# Patient Record
Sex: Male | Born: 1935 | Race: White | Hispanic: No | Marital: Married | Smoking: Former smoker
Health system: Southern US, Community
[De-identification: ages and names within clinical notes are randomized; demographics above are authoritative.]

## PROBLEM LIST (undated history)

## (undated) DIAGNOSIS — R269 Unspecified abnormalities of gait and mobility: Secondary | ICD-10-CM

## (undated) DIAGNOSIS — F1010 Alcohol abuse, uncomplicated: Secondary | ICD-10-CM

## (undated) DIAGNOSIS — M1990 Unspecified osteoarthritis, unspecified site: Secondary | ICD-10-CM

## (undated) DIAGNOSIS — R413 Other amnesia: Secondary | ICD-10-CM

## (undated) DIAGNOSIS — H3530 Unspecified macular degeneration: Secondary | ICD-10-CM

## (undated) DIAGNOSIS — C61 Malignant neoplasm of prostate: Secondary | ICD-10-CM

## (undated) DIAGNOSIS — F429 Obsessive-compulsive disorder, unspecified: Secondary | ICD-10-CM

## (undated) HISTORY — DX: Malignant neoplasm of prostate: C61

## (undated) HISTORY — DX: Alcohol abuse, uncomplicated: F10.10

## (undated) HISTORY — PX: FRACTURE SURGERY: SHX138

## (undated) HISTORY — DX: Obsessive-compulsive disorder, unspecified: F42.9

## (undated) HISTORY — DX: Other amnesia: R41.3

## (undated) HISTORY — DX: Unspecified macular degeneration: H35.30

## (undated) HISTORY — DX: Unspecified abnormalities of gait and mobility: R26.9

---

## 1998-05-23 ENCOUNTER — Encounter: Admission: RE | Admit: 1998-05-23 | Discharge: 1998-07-03 | Payer: Self-pay | Admitting: Specialist

## 1998-10-08 ENCOUNTER — Encounter: Admission: RE | Admit: 1998-10-08 | Discharge: 1998-10-08 | Payer: Self-pay | Admitting: Internal Medicine

## 2000-02-17 ENCOUNTER — Encounter: Admission: RE | Admit: 2000-02-17 | Discharge: 2000-02-17 | Payer: Self-pay | Admitting: Internal Medicine

## 2000-06-22 ENCOUNTER — Encounter: Admission: RE | Admit: 2000-06-22 | Discharge: 2000-06-22 | Payer: Self-pay | Admitting: Internal Medicine

## 2000-11-02 ENCOUNTER — Encounter: Payer: Self-pay | Admitting: Specialist

## 2000-11-05 ENCOUNTER — Inpatient Hospital Stay (HOSPITAL_COMMUNITY): Admission: RE | Admit: 2000-11-05 | Discharge: 2000-11-13 | Payer: Self-pay | Admitting: Specialist

## 2000-11-08 ENCOUNTER — Encounter: Payer: Self-pay | Admitting: Specialist

## 2000-11-10 ENCOUNTER — Encounter: Payer: Self-pay | Admitting: Specialist

## 2000-11-11 ENCOUNTER — Encounter: Payer: Self-pay | Admitting: Specialist

## 2001-02-26 ENCOUNTER — Encounter: Admission: RE | Admit: 2001-02-26 | Discharge: 2001-02-26 | Payer: Self-pay | Admitting: Internal Medicine

## 2001-11-10 ENCOUNTER — Encounter: Admission: RE | Admit: 2001-11-10 | Discharge: 2001-11-10 | Payer: Self-pay | Admitting: Internal Medicine

## 2002-07-13 ENCOUNTER — Encounter: Admission: RE | Admit: 2002-07-13 | Discharge: 2002-07-13 | Payer: Self-pay | Admitting: Internal Medicine

## 2002-11-18 ENCOUNTER — Encounter: Admission: RE | Admit: 2002-11-18 | Discharge: 2002-11-18 | Payer: Self-pay | Admitting: Internal Medicine

## 2003-01-13 ENCOUNTER — Encounter: Admission: RE | Admit: 2003-01-13 | Discharge: 2003-01-13 | Payer: Self-pay | Admitting: Internal Medicine

## 2003-06-01 ENCOUNTER — Encounter: Admission: RE | Admit: 2003-06-01 | Discharge: 2003-06-01 | Payer: Self-pay | Admitting: Internal Medicine

## 2003-10-19 ENCOUNTER — Encounter: Admission: RE | Admit: 2003-10-19 | Discharge: 2003-10-19 | Payer: Self-pay | Admitting: Internal Medicine

## 2003-10-19 ENCOUNTER — Other Ambulatory Visit: Admission: RE | Admit: 2003-10-19 | Discharge: 2003-10-19 | Payer: Self-pay | Admitting: Internal Medicine

## 2003-10-19 ENCOUNTER — Encounter (INDEPENDENT_AMBULATORY_CARE_PROVIDER_SITE_OTHER): Payer: Self-pay | Admitting: Internal Medicine

## 2005-08-27 ENCOUNTER — Ambulatory Visit: Payer: Self-pay | Admitting: Gastroenterology

## 2007-04-30 ENCOUNTER — Ambulatory Visit: Payer: Self-pay | Admitting: Internal Medicine

## 2007-04-30 DIAGNOSIS — J329 Chronic sinusitis, unspecified: Secondary | ICD-10-CM | POA: Insufficient documentation

## 2007-04-30 DIAGNOSIS — D291 Benign neoplasm of prostate: Secondary | ICD-10-CM

## 2007-04-30 DIAGNOSIS — M129 Arthropathy, unspecified: Secondary | ICD-10-CM | POA: Insufficient documentation

## 2008-04-24 ENCOUNTER — Encounter: Admission: RE | Admit: 2008-04-24 | Discharge: 2008-04-24 | Payer: Self-pay

## 2008-04-28 ENCOUNTER — Encounter: Admission: RE | Admit: 2008-04-28 | Discharge: 2008-04-28 | Payer: Self-pay | Admitting: Orthopedic Surgery

## 2008-05-03 ENCOUNTER — Encounter: Admission: RE | Admit: 2008-05-03 | Discharge: 2008-05-03 | Payer: Self-pay | Admitting: Orthopedic Surgery

## 2008-08-21 ENCOUNTER — Encounter: Payer: Self-pay | Admitting: Internal Medicine

## 2008-11-09 ENCOUNTER — Encounter: Payer: Self-pay | Admitting: Internal Medicine

## 2009-01-30 ENCOUNTER — Encounter: Payer: Self-pay | Admitting: Internal Medicine

## 2009-03-28 ENCOUNTER — Ambulatory Visit (HOSPITAL_COMMUNITY): Admission: RE | Admit: 2009-03-28 | Discharge: 2009-03-28 | Payer: Self-pay | Admitting: Urology

## 2010-08-23 NOTE — H&P (Signed)
[place]. [facility]  Patient:    Carlos Torres, Carlos Torres                   MRN: [id number] Adm. Date:  11/05/00 Attending:  R. Valma Cava, M.D. Dictator:   Irena Cords, P.A.-C. CC:         Madaline Guthrie, M.D.   History and Physical  DATE OF BIRTH:  05/31/1922  SS# [id number]  CHIEF COMPLAINT:  Right knee pain.  HISTORY OF PRESENT ILLNESS:  Carlos Torres is a 77 year old male who presents with increasing right knee pain.  This has been bothering him for a period of years.  He has had a fairly extensive history in regard to the right leg.  Back in 1943 he had osteomyelitis of the right hip at age 12.  This ended up draining spontaneously.  He has had multiple surgical debridements subsequently.  He has done fairly well since that time and has just had an antalgic gait.  He does now have a spontaneous hip fusion.  He has had increasing knee pain, though, over the last few years.  He did undergo a left total knee replacement back in 1998 by Dr. Thedore Mins and has done well from this surgery.  He presents now for consideration of surgical intervention.  REVIEW OF SYSTEMS:  He denies any diplopia or blurred vision.  No headaches or dizziness.  No rhinorrhea, sore throat, or ear ache.  No cough, chest pain, or shortness of breath.  No nausea, vomiting, diarrhea, or constipation.  No abdominal pain.  No dysuria or hematuria.  No melena or bright red blood per rectum.  No numbness and tingling in his extremities.  PAST MEDICAL HISTORY: 1. Osteomyelitis, right hip per the HPI. 2. History of kidney stones. 3. History of benign prostatic hypertrophy.  PAST SURGICAL HISTORY: 1. Left total knee 1998. 2. Right hip multiple procedures per HPI. 3. Left knee arthroscopy. 4. Tonsillectomy. 5. Left inguinal hernia repair. 6. Colonoscopy. 7. Basal cell skin cancer removal.  ALLERGIES:  No known drug allergies.  CURRENT MEDICATIONS:  Aspirin,  ibuprofen, multivitamin.  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  He is married with five children.  Patients primary physicians are [name] for GI and [name] at [facility].  FAMILY HISTORY:  Significant for prostate cancer in his father at age 94.  His mother died at age 53 with anemia.  PHYSICAL EXAMINATION  VITAL SIGNS:  Temperature 97.9, pulse 84, respiratory rate 14, blood pressure 110/80.  GENERAL:  This is a well-developed, well-nourished male in no acute distress.  HEENT:  Head is atraumatic, normocephalic.  Oropharynx is clear without redness, exudate, or lesions.  Pupils are equal, round and reactive to light. Extraocular movements are grossly intact.  NECK:  Supple without cervical lymphadenopathy.  No carotid bruits.  LUNGS:  Clear to auscultation bilaterally with no wheezes or crackles.  HEART:  Regular rate and rhythm with no murmurs, rubs, or gallops.  ABDOMEN:  Soft, nontender, nondistended with no masses, no hepatosplenomegaly. Positive bowel sounds.  GENITOURINARY:  Not performed.  Not pertinent to present illness.  BREASTS:  Not performed.  Not pertinent to present illness.  SKIN:  Intact without rashes or lesions.  EXTREMITIES:  Range of motion of the right knee is 0-110 degrees.  He does have slight varus alignment, trace effusion.  Hip with multiple surgical scars, well healed.  No draining tracts  or sinuses.  LABORATORIES:  X-rays of his right knee demonstrate tricompartmental degenerative arthritis.  IMPRESSION: 1. Right knee osteoarthritis/degenerative joint disease. 2. History of osteomyelitis, right hip at age 57. 3. History of kidney stones. 4. Benign prostatic hypertrophy.  PLAN:  Patient will be admitted to [facility] to undergo a right total knee arthroplasty by [name] on August 1.  Preoperative laboratories and signed surgical consents will be obtained.   We have contacted [name] office regarding preoperative medical clearance.  The last time they saw the patient was back in November 2001.  As of right now they do not have a medical clearance letter for [place].  Apparently this is pending.  I have discussed this with the patient and he will be in contact with his medical physician there for preoperative medical clearance prior to surgery.  We have answered all questions for him today. DD:  11/02/00 TD:  11/02/00 Job: [id number]

## 2010-08-23 NOTE — Op Note (Signed)
[place]. [facility]  Patient:    Carlos Torres, Carlos Torres                 MRN: [id number] Proc. Date: 11/05/00 Adm. Date:  [id number] Attending:  Erasmo Leventhal                           Operative Report  PREOPERATIVE DIAGNOSIS:  Right knee end-stage osteoarthritis.  PROCEDURE:  Right total knee arthroplasty.  SURGEON:  R. Valma Cava, M.D.  ASSISTANT:  Ralene Bathe, P.A.  ANESTHESIA:  General.  ESTIMATED BLOOD LOSS:  Less than 100 cc.  DRAINS:  Two medium Hemovacs.  COMPLICATIONS:  None.  TOURNIQUET TIME:  2 hours 6 minutes at 375 mmHg.  DISPOSITION:  PACU stable.  COMPLICATIONS:  None.  OPERATIVE IMPLANTS:  Osteonics components, posterior-stabilized, size 13 femur, size 13 tibia, 12 mm polyethylene insert, 28 mm polyethylene patella, all cemented.  DESCRIPTION OF PROCEDURE:  The patient was counseled in the holding area. 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  I spent quite some time with the patient reviewing the diagram he had drawn and personally helped in positioning the patient on the OR table. In addition, Dr. Gypsy Balsam was fully informed of the positioning of the patient necessary, and we proceeded to the operating room.  IV antibiotics had been given after IV started.  In the operating room with the patient awake, he told us how to bump him and to place him for the most comfort while he was awake, and we preceded that with a roll underneath the lumbar spine and a bump underneath the right hip. In addition, while he was awake we flexed the right knee and hip to the point where he felt comfortable and then marked the point where he began to feel discomfort and stopped at that point during the surgery.  Every precaution imaginable was given to make sure we protected his right hip and did not injure  his lower back or right hip.  He was then placed under general anesthesia.  A Foley catheter was placed utilizing sterile technique by the OR circulating nurse.  Throughout the entire procedure, we were the most cautious with his positioning throughout the entire case.  The right lower extremity was elevated, prepped with Duraprep, and draped in a sterile fashion.  Exsanguinated with an Esmarch, and the tourniquet was inflated to 375 mmHg.  A straight midline incision made through the skin and subcutaneous tissue, small bleeders electrocoagulated.  Hemostasis was obtained in cutaneous veins.   A medial parapatellar arthrotomy was performed, and a slight medial soft tissue release of the proximal medial tibia.  The bone was found to be markedly dysplastic from his chronic problem going back to childhood.  He had severe osteoporosis, large trabeculae of the bone, and extremely soft with large osteophytes.  Cruciate ligaments were resected.  He had a 10 degree flexion contracture in his knee before surgery.  At this point in time a starter hole was made in the distal femur and irrigated, and the intramedullary rod was gently placed.  I will also note that at this point in time with the history of his infection and possible tumor but, however, being quiescent since the 1950s, I used a very short intramedullary rod and did not go to the  area where his bone was previously infected.  It was set for 5 degree valgus, and we took a 12 mm cut off the distal femur.  At this point in time I was unable due to the lack of flexion to get the tibial and femoral sizing rod.  The medial and lateral menisci were removed under direct visualization.  The tibial eminence was resected and osteophytes of the proximal tibia.  A starting hole was made and a step reamer was utilized, and the tibial canal was gently irrigated and intramedullary rod was placed.  I chose the 5 degree posterior slope cut off the  proximal tibia and took a 10 mm cut off the tibia based upon the lateral side.  Done at the appropriate level. Severe soft bone and marked osteophytes were removed.  Geniculates were coagulated at the meniscal insertions.  Attention directed back to the proximal tibia, which was found to be a size #13.  Attention directed back to the femur, which was found to be a size #13.  Rotational marks were made, and the distal femur was cut to fit a size #13.  Posteromedial and posterolateral femoral osteophytes were removed under direct visualization.  He additionally had several areas of cysts in the femur.  These were curetted, posterolateral femoral condyle and anterolateral area.  These were curetted back to healthy bone and were later filled with cement.  Femoral trochlea was prepared in standard fashion.  At this point in time with a size 13 femur, size 13 tibia, with a 10 mm insert with excellent range of motion and soft tissue balance, rotational marks were made and then the delta keel was performed in the standard fashion.  I will note that the patient did have a posteromedial tibial osteophyte, and it was removed as much as possible.  The patella was found to be a size #28.  It was reamed to a depth of 28 mm, locking holes were made, and the excess bone was removed.  At this point utilizing Modern cement technique, all components were gently and meticulously cemented into place, size 13 femur, size 13 tibia, with a 28 patella.  At this time after the cement had cured, we went through trials with 10 and 12 mm tibial inserts, and the 12 mm insert made an excellent range of motion and soft tissue balance.  I will also note that with the cement, due to his previous infection, I opted to put antibiotic into his cement prophylactically.  We chose the appropriate dose of vancomycin, that being 500 mg per pack of cement, and we mixed two packs of cement and used a total of 1.0 g of powdered  vancomycin, which was appropriate for the cement.  I will note that the chart had also been reviewed, and his germ at the time of infection in the 1950s was Staphylococcus aureus, and it was felt this was the  appropriate antibiotic for him.  Excess cement was removed.  The final 12 mm tibial insert was tapped into position.  Bone wax was placed into the intercondylar notch region and exposed bony surfaces.  At this time I felt the patient was well-aligned and balanced and there had been no complication.  The patellofemoral tracking was lateral, and a lateral release was performed giving anatomic patellofemoral tracking.  I made sure I found the superior lateral geniculate, and this was coagulated.  Two medium Hemovac drains were placed.  Each layer was irrigated with antibiotic solution during the closure. Also  at the beginning of the case at the time of opening the knee, there was no evidence at all of any infection of the knee.  Sequential close in layers was done, synovium Vicryl, arthrotomy Vicryl, subcutaneous Vicryl, skin closed with staples.  Marcaine 0.5% with epinephrine 20 cc was placed into the drain into the knee joint to help with postop pain and hemostasis.  A sterile dressing was applied to the knee, a compressive wrap, and the tourniquet was deflated.  He had excellent alignment and excellent pulses at the end of the case. Ice pack, knee immobilizer applied in full extension.  He was also given another gram of Ancef at the end of the case.  There were no complications. Sponge and needle count were correct.  He was awakened, and he was taken from the operating room to the PACU in stable condition.  I will note that the patients wishes for care and concern about his right lower extremity, hip, and lower back was done throughout the procedure, and we were extremely cautious with his positioning throughout the entire case at each step.  The patient was doing well in the  recovery room at the time of this dictation. DD:  11/05/00 TD:  11/06/00 Job: [id number]

## 2010-08-23 NOTE — Discharge Summary (Signed)
. [facility]  Patient:    Carlos Torres, Carlos Torres Visit Number: [id number] MRN: [id number]          Service Type: SUR Location: 5000 [id number] Attending Physician:  Carlos Torres Dictated by:   Carlos Torres, P.A.-C Admit Date:  11/05/2000 Discharge Date: 11/13/2000                             Discharge Summary  ADMITTING DIAGNOSIS:  Right knee end-stage osteoarthritis.  DISCHARGE DIAGNOSES: 1. Right knee end-stage osteoarthritis. 2. Pseudogout of the left wrist.  PROCEDURE:  Right total knee arthroplasty.  CONSULTATION:  [facility].  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 Valma Torres at [facility].  The patient has an extensive history of increasing right knee pain.  The patient states that his right knee pain has been bothering him for a period of years.  He does have a history of osteomyelitis of the right hip since age 77.  He is status post multiple surgical debridements subsequently for that problem and has done well since that time.  The patient has had a left total replacement in 1998, per Dr. Thedore Torres and tolerated it very well.  Due to his increasing pain and limitation and interference with his daily activities, the decision was made to proceed with surgical diagnosis and treatment.  All questions were encouraged and answered concerning the right knee total arthroplasty.  PHYSICAL EXAMINATION:  GENERAL:  Well-developed, well-nourished, male appearing his stated age in no acute distress.  VITAL SIGNS:  Blood pressure 110/80, pulse 84, respiratory rate 14 and temperature 97.9.  EXTREMITIES:  Range of motion of the right knee approximately 0-110 degrees with a slight varus alignment with no trace effusion with diffuse tenderness of the patella and joint line.  X-rays revealed  right knee tricompartmental degenerative arthritis.  HOSPITAL COURSE:  On November 05, 2000, Carlos Torres underwent a right total knee arthroplasty per Carlos Torres without complications.  Please see operative report for details.  On postop day #1, the patient was doing fairly well.  He complained of no nausea or vomiting.  His pain was well-controlled.  He was afebrile with vital signs stable.  H&H was 12.1 and 34.9.  PT was 13.9 and INR was 1.1.  Physical examination revealed that the right lower extremities and motor function was intact as well as neurovascular exam being intact as well. His Hemovac was removed without difficulty.  The following day, his condition remained stable, however, on postop day #3, the patients temperature spiked to 100.4, white blood cell count 11.7, hemoglobin 10.3.  The patient was complaining of left elbow pain at this time.  He did state that he had a history of left elbow osteoarthritis.  Physical examination of the left elbow showed diffuse swelling and tenderness with warmth, but no erythema or induration noted.  Examination of the right knee was unremarkable.  His incision was clean, dry and intact.  No erythema, no discharge and negative Homans.  An x-ray of the left elbow was obtained and showed severe degenerative osteoarthritis.  Therefore, the elbow was elevated as well as ice pack applied p.r.n.  A posterior splint was applied for comfort.  The patient was also encouraged to utilize the spirometer.  The following day, the patients T-max was 101.4.  However,  he stated that he felt much better than the previous day and his pain was well-controlled.  He also stated that left elbow felt much better.  Physical examination revealed his chest to be clear to auscultation bilaterally.  Heart was regular rate and rhythm.  Abdomen was soft and flat, nontender.  Bowel sounds were normoactive.  Exam of the right knee showed the dressings were clean, dry and  intact.  The incision was without erythema or discharge with no signs of infection.  Motor function of the right lower extremity was intact as well as neurovascular status.  He had 2+ pedal pulses and sensation intact as well.  Labs and x-rays showed a UA previously ordered which was unremarkable for any signs of urinary tract infection.  A chest x-ray had also previously been ordered and showed mild basilar atelectasis.  The patient was encouraged to ambulate to move from the bed to the chair as he previously had been noncompliant with PT.  On postop day #5, the patient had no complaints concerning his right knee.  In fact, he stated, "it was doing great." However, he did state that he an episode the previous night when he was using the rest room and having a bowel movement that he became very dizzy and felt like he was going to pass out.  Afterwards, the patient stated he had an odd chest pain.  At the current time, he denied any shortness of breath, syncopal episodes or diaphoresis.  His blood pressure was 110/75, pulse 93, respiratory rate 20, temperature 101 with O2 saturations 97% on room air.  Examination of the chest with lungs clear bilaterally.  No wheezing, rales or rhonchi noted. Heart was regular rate and rhythm with no murmurs.  Abdomen was benign.  Right knee exam was unremarkable.  His incision was clean, dry and intact.  He had good dorsiflexion and plantar flexion.  He was neurovascularly intact.  Due to the fact that the patient had an intermittent fever that continued to wax and wane and due to his previous symptoms the night before, [facility] was consulted.  The patient had a repeat CBC with differential and a repeat UA as well.  Coumadin management per pharmacy showed an INR of 1.7. The patient was seen and examined by [facility].  Blood cultures were ordered and were found to subsequently be negative.  CT of the chest was ordered  and was negative for PE or pneumonia.  On postop day #6, Dr. Cheral Torres stated that he felt much better and his pain was  well-controlled.  He denied any shortness of breath, chest pains, nausea or vomiting.  He did complain of left wrist pain.  Laboratory data showed his H&H was 9.7 and 28.  He had a temperature of 100.5, blood pressure was 134/70, pulse 90, respiratory rate 22.  Examination of the right knee was unremarkable.  Examination of the left wrist did show swelling or erythema noted in the area of the ulnar styloid.  There was localized tenderness as well as warmth noted.  There is no evidence of a cellulitis.  A three-view x-ray of the left wrist was ordered which revealed chondrocalcinosis and CPPTD which is indicative of pseudogout.  The patient was then started on a trial of Vioxx 50 mg one p.o. q.d.  The following day on postop day #9, he stated that his wrist pain was greatly improved and that his knee was doing quite well. The patient was very  eager to go home.  Physical examination revealed that his blood pressure was 124/70, pulse 81, afebrile at 98.3.  H&H was stable.  WBCs were at 12,000.  INR was therapeutic.  Physical exam of the left wrist showed a decrease in the swelling as well as erythema.  There was not as much point tenderness in the area of the styloid process.  Right knee examination showed the incision to be clean, dry and intact.  Motor function was intact. Neurovascularly, he was intact.  Due to Dr. Andreas Blower improved state, the decision was made to proceed with discharge.  DISPOSITION:  Discharged to home to care of family with home health care services provided by [place].  DIET:  Regular.  ACTIVITIES:  Weightbearing as tolerated.  WOUND CARE:  Keep wound clean, dry and dressing changed.  SPECIAL INSTRUCTIONS:  He may shower ad lib.  DISCHARGE MEDICATIONS: 1. Percocet 5/325 one to two p.o. q.4-6h. p.r.n. pain, #40. 2. Robaxin 500 mg one  p.o. q.6-8h. p.r.n. spasm, #30, no refills. 3. Trinsicon one p.o. t.i.d., #90, no refill. 4. Vioxx 25 mg one p.o. q.d., #30 with no refill. 5. Coumadin dosage will be followed by pharmacy.  FOLLOWUP:  Follow up with Dr. Valma Torres in seven days.  He is to call [phone], for an appointment. Dictated by:   Carlos Torres, P.A.-C Attending Physician:  Carlos Torres DD:  12/04/00 TD:  12/04/00 Job: [id number]

## 2011-12-17 ENCOUNTER — Emergency Department (HOSPITAL_COMMUNITY): Payer: Medicare Other

## 2011-12-17 ENCOUNTER — Emergency Department (HOSPITAL_COMMUNITY)
Admission: EM | Admit: 2011-12-17 | Discharge: 2011-12-17 | Disposition: A | Discharge status: Home / Self Care | Payer: Medicare Other | Attending: Emergency Medicine | Admitting: Emergency Medicine

## 2011-12-17 ENCOUNTER — Encounter (HOSPITAL_COMMUNITY): Payer: Self-pay | Admitting: *Deleted

## 2011-12-17 DIAGNOSIS — E871 Hypo-osmolality and hyponatremia: Secondary | ICD-10-CM | POA: Insufficient documentation

## 2011-12-17 DIAGNOSIS — R410 Disorientation, unspecified: Secondary | ICD-10-CM

## 2011-12-17 DIAGNOSIS — R42 Dizziness and giddiness: Secondary | ICD-10-CM | POA: Insufficient documentation

## 2011-12-17 DIAGNOSIS — L989 Disorder of the skin and subcutaneous tissue, unspecified: Secondary | ICD-10-CM | POA: Insufficient documentation

## 2011-12-17 DIAGNOSIS — F29 Unspecified psychosis not due to a substance or known physiological condition: Secondary | ICD-10-CM | POA: Insufficient documentation

## 2011-12-17 DIAGNOSIS — R000 Tachycardia, unspecified: Secondary | ICD-10-CM | POA: Insufficient documentation

## 2011-12-17 DIAGNOSIS — Z79899 Other long term (current) drug therapy: Secondary | ICD-10-CM | POA: Insufficient documentation

## 2011-12-17 HISTORY — DX: Unspecified osteoarthritis, unspecified site: M19.90

## 2011-12-17 LAB — COMPREHENSIVE METABOLIC PANEL
ALT: 21 U/L (ref 0–53)
AST: 63 U/L — ABNORMAL HIGH (ref 0–37)
Albumin: 3.6 g/dL (ref 3.5–5.2)
Calcium: 10.3 mg/dL (ref 8.4–10.5)
Sodium: 128 mEq/L — ABNORMAL LOW (ref 135–145)
Total Protein: 7.7 g/dL (ref 6.0–8.3)

## 2011-12-17 LAB — CBC
MCH: 33.8 pg (ref 26.0–34.0)
MCHC: 35.2 g/dL (ref 30.0–36.0)
Platelets: 336 10*3/uL (ref 150–400)
RBC: 4.47 MIL/uL (ref 4.22–5.81)
RDW: 13 % (ref 11.5–15.5)

## 2011-12-17 LAB — GLUCOSE, CAPILLARY: Glucose-Capillary: 201 mg/dL — ABNORMAL HIGH (ref 70–99)

## 2011-12-17 LAB — URINALYSIS, ROUTINE W REFLEX MICROSCOPIC
Glucose, UA: NEGATIVE mg/dL
Leukocytes, UA: NEGATIVE
Nitrite: NEGATIVE
pH: 6 (ref 5.0–8.0)

## 2011-12-17 LAB — PROTIME-INR: INR: 0.92 (ref 0.00–1.49)

## 2011-12-17 MED ORDER — SODIUM CHLORIDE 0.9 % IV BOLUS (SEPSIS)
1000.0000 mL | Freq: Once | INTRAVENOUS | Status: AC
  Administered 2011-12-17: 1000 mL via INTRAVENOUS

## 2011-12-17 NOTE — ED Notes (Signed)
Pt is here with 2 days of increasing vertigo and confusion.  Today got up and passed out and lost all visual contact with environment.  Pt passed out onto bed.  Pt had mild diabetes but not on medications.  PT is alert and follows commands, no slurred speech or facial droop.

## 2011-12-17 NOTE — ED Notes (Signed)
EDP at bedside  

## 2011-12-17 NOTE — ED Notes (Signed)
Pt lives at home with his wife, pt reports today while going to pick up a shoe he passed out landing on the bed. States he was only out for a couple seconds. Pt denies and chest pain associated. Pt states that for the last couple days he has been more dizzy than usual, pt with hx of vertigo. At this time pt denies dizziness. Family at bedside states that pt has been more confused than usual over the last week, not remembering things he usually would remember. At this time pt has no complaints of pain, pt with several orthopedic issues like chronic back pain and hip pain.

## 2011-12-17 NOTE — ED Provider Notes (Signed)
History     CSN: [id number]  Arrival date & time 12/17/11  1522   First MD Initiated Contact with Patient 12/17/11 1608      Chief Complaint  Patient presents with  . Dizziness  . Altered Mental Status    Patient is a 78 year old male who presents with 3 day history of altered mental status and questionable syncopal episode.  Daughter reports that over the last 3 days patient has been intermittently answering questions inappropriately as well as patient not being able to remember names that he normally would.  Patient reports syncopal episode prior to arrival in the emergency department. He states he was bending over to pick up to and after doing so he fell forward ladder onto the bed. He think that he loss consciousness for proximately 3 seconds and had no prior symptoms.  In regards to vertigo patient states that over the last few months he has had intermittent episodes of what he describes as surrounding spinning. He has seen outpatient neurology and [place] and there they told her this was positional vertigo and no formal imaging has been done.     (Consider location/radiation/quality/duration/timing/severity/associated sxs/prior treatment) HPI  Past Medical History  Diagnosis Date  . Arthritis     Past Surgical History  Procedure Date  . Fracture surgery     No family history on file.  History  Substance Use Topics  . Smoking status: Former Games developer  . Smokeless tobacco: Not on file  . Alcohol Use: Yes     daily      Review of Systems  All other systems reviewed and are negative.    Allergies  Review of patient's allergies indicates no known allergies.  Home Medications   Current Outpatient Rx  Name Route Sig Dispense Refill  . ASPIRIN 325 MG PO TABS Oral Take 325 mg by mouth daily.    Marland Kitchen CALCIUM CARBONATE 600 MG PO TABS Oral Take 600 mg by mouth daily.    Marland Kitchen VITAMIN D 1000 UNITS PO TABS Oral Take 5,000 Units by mouth daily.    Ocie Bob PO Oral Take 1 cm by  mouth.    . OMEGA-3 FATTY ACIDS 1000 MG PO CAPS Oral Take 1 g by mouth daily.    Marland Kitchen FLUOXETINE HCL 20 MG PO CAPS Oral Take 40 mg by mouth daily.    Marland Kitchen HYDROCODONE-ACETAMINOPHEN 5-325 MG PO TABS Oral Take 1 tablet by mouth every 6 (six) hours as needed. For pain.    . ADULT MULTIVITAMIN W/MINERALS CH Oral Take 1 tablet by mouth daily.    Marland Kitchen POLYETHYLENE GLYCOL 3350 PO PACK Oral Take 17 g by mouth daily as needed. For constipation.      BP 144/76  Pulse 107  Temp 97.6 F (36.4 C) (Oral)  Resp 18  SpO2 96%  Physical Exam  Nursing note and vitals reviewed. Constitutional: He appears well-developed and well-nourished.  HENT:  Head: Normocephalic and atraumatic.  Right Ear: External ear normal.  Left Ear: External ear normal.  Nose: Nose normal.  Mouth/Throat: Oropharynx is clear and moist.  Eyes: Conjunctivae normal and EOM are normal. Pupils are equal, round, and reactive to light.  Neck: Normal range of motion. Neck supple.  Cardiovascular: Regular rhythm.  Tachycardia present.   Pulmonary/Chest: Effort normal and breath sounds normal. No respiratory distress. He has no wheezes. He has no rales. He exhibits no tenderness.  Abdominal: Soft. Bowel sounds are normal. He exhibits no distension and no mass. There is no  tenderness. There is no rebound and no guarding.  Neurological: He is alert. He has normal reflexes. He displays normal reflexes. No cranial nerve deficit. He exhibits normal muscle tone. Coordination normal.       Oriented to person and place - change over the past several months per daughter.    Skin: Skin is warm.       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."    ED Course  Procedures (including critical care time)  Labs Reviewed  CBC - Abnormal; Notable for the following:    WBC 12.2 (*)     All other components within normal limits  COMPREHENSIVE METABOLIC PANEL -  Abnormal; Notable for the following:    Sodium 128 (*)     Chloride 89 (*)     Glucose, Bld 186 (*)     AST 63 (*)     GFR calc non Af Amer 74 (*)     GFR calc Af Amer 85 (*)     All other components within normal limits  URINALYSIS, ROUTINE W REFLEX MICROSCOPIC - Abnormal; Notable for the following:    Color, Urine AMBER (*)  BIOCHEMICALS MAY BE AFFECTED BY COLOR   All other components within normal limits  GLUCOSE, CAPILLARY - Abnormal; Notable for the following:    Glucose-Capillary 201 (*)     All other components within normal limits  PROTIME-INR   Dg Chest 2 View  12/17/2011  *RADIOLOGY REPORT*  Clinical Data: Dizziness, altered mental status.  CHEST - 2 VIEW  Comparison: None.  Findings: Study is AP lordotic in positioning.  Heart is normal size.  No visible airspace opacities.  No acute bony abnormality or effusions.  IMPRESSION: No active cardiopulmonary disease.   Original Report Authenticated By: Cyndie Chime, M.D.    Ct Head Wo Contrast  12/17/2011  *RADIOLOGY REPORT*  Clinical Data: Dizziness.  Altered mental status.  CT HEAD WITHOUT CONTRAST  Technique:  Contiguous axial images were obtained from the base of the skull through the vertex without contrast.  Comparison: None.  Findings: There is atrophy and chronic small vessel disease changes. No acute intracranial abnormality.  Specifically, no hemorrhage, hydrocephalus, mass lesion, acute infarction, or significant intracranial injury.  No acute calvarial abnormality. Visualized paranasal sinuses and mastoids clear.  Orbital soft tissues unremarkable.  IMPRESSION: No acute intracranial abnormality.  Atrophy, chronic microvascular disease.   Original Report Authenticated By: Cyndie Chime, M.D.       Date: 12/17/2011  Rate: 113   Rhythm: sinus tachycardia  QRS Axis: left  Intervals: normal  ST/T Wave abnormalities: nonspecific ST changes  Conduction Disutrbances:none  Narrative Interpretation: LVH  Old EKG Reviewed:  changes noted    1. Hyponatremia   2. Vertigo   3. Confusion       MDM    Patient is 78 year old male who presents with increasing confusion and vertigo. Upon arriving in emergency department patient was noted to be afebrile and vital signs remarkable for heart rate in the low 100s. On exam patient with numerous skin lesions (chronic per daughter), but no focal neurological deficits including being able to ambulate without change from baseline. Given reported confusion it was felt that infectious source workup was warranted. Review of patient's chest x-ray and urine studies showed no evidence of infection.  Review of patient's labs remarkable only for mildly elevated AST at 63 and hyponatremia of 128.  Patient was given IV fluids and status post treatment heart rate noted to be less than 100.  Head CT was completed due to increasing confusion and vertigo that has not been investigated with imaging.  Review results with no acute abnormality.  EKG completed due to tachycardia and showed sinus tach but no evidence of ischemia.  Patient given IVF, and s/p treatment HR less than 100.  It was not felt that patient met definitive criteria for admission.  Long discussion with family was completed in regards to admission versus discharge with close PCP follow.  Family agreed that patient could be discharged with PCP follow up.  In regards to hyponatremia, patient instructed on fluid restriction.  Patient discharged without acute events.         Johnney Ou, MD 12/18/11 [phone]

## 2011-12-17 NOTE — ED Notes (Signed)
Pt states understanding of discharge instructions 

## 2011-12-17 NOTE — ED Notes (Signed)
Patient states he is unable to void at this time. 

## 2011-12-17 NOTE — ED Notes (Signed)
Patient transported to CT 

## 2011-12-18 NOTE — ED Provider Notes (Signed)
I saw and evaluated the patient, reviewed the resident's note and I agree with the findings and plan.  The patient presents for evaluation of increased confusion, mental status change for the past several days.  There was some sort of unresponsive/syncopal episode that occurred prior to his presentation here, however he is now asymptomatic.  He currently lives at home with his wife with 24 in-home assistance.  On exam, the patient is afebrile and the vitals are stable.  The heart is rrr and the lungs are clear.  The abdomen is benign.  The neurological exam is unremarkable.  The workup reveals a normal head ct, labs that show a sodium of 128, and chest xray that is without acute process.  I had a lengthy conversation with the patient and family regarding the final disposition.  The pros and cons of admission were discussed and ultimately the family felt as though he would be more comfortable at home.  He was hydrated with ns and the family was advised to restrict fluid intake.  He is to follow up with his pcp to arrange a repeat bmp in the next few days, return prn for any problems.  Carlos Lyons, MD 12/18/11 [phone]

## 2013-10-23 IMAGING — CR DG CHEST 2V
1 series · 1 of 1 positions shown · non-contrast
Comparison: None.

CLINICAL DATA: Dizziness, altered mental status.

CHEST - 2 VIEW

[view not recorded]
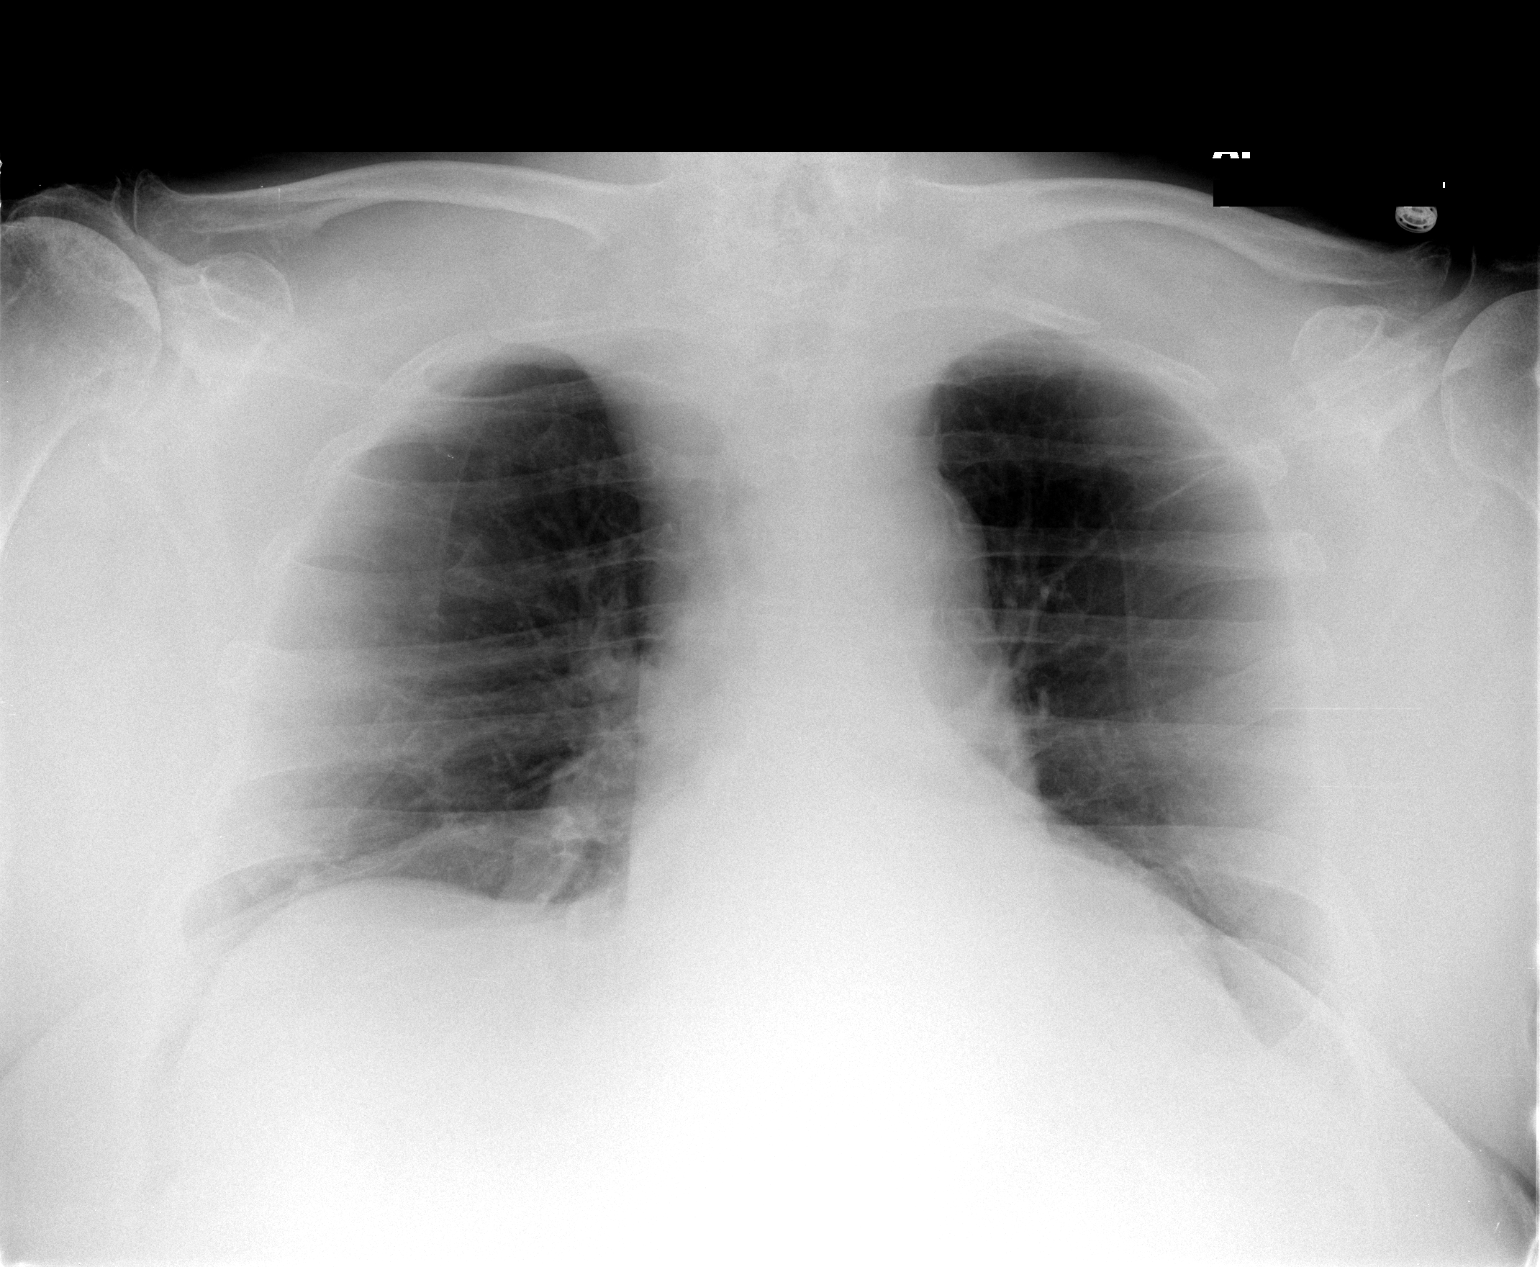

[1 of 1 positions shown; findings below may reference images not displayed]

FINDINGS: Study is AP lordotic in positioning.  Heart is normal
size.  No visible airspace opacities.  No acute bony abnormality or
effusions.
IMPRESSION: No active cardiopulmonary disease.

## 2013-10-23 IMAGING — CT CT HEAD W/O CM
1 of 2 series · 13 of 30 positions shown, 17 images · non-contrast
Comparison: None.

CLINICAL DATA: Dizziness.  Altered mental status.

CT HEAD WITHOUT CONTRAST
TECHNIQUE: Contiguous axial images were obtained from the base of
the skull through the vertex without contrast.

[Series 2: brain · axial · 0.47mm/px · z∈[+91,+228]mm · 13 of 32 slices shown, 17 images]
[im 3/32  brain]
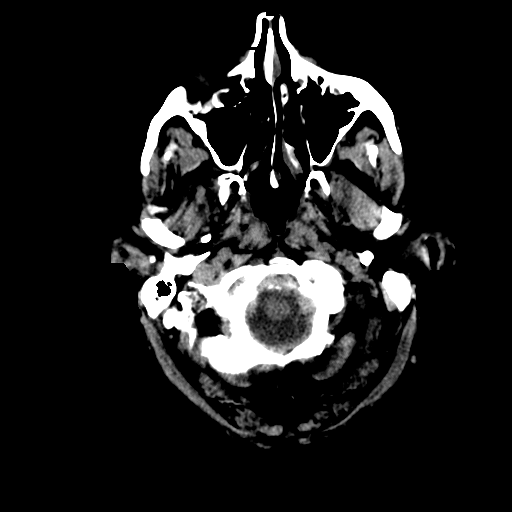
[im 3/32  bone]
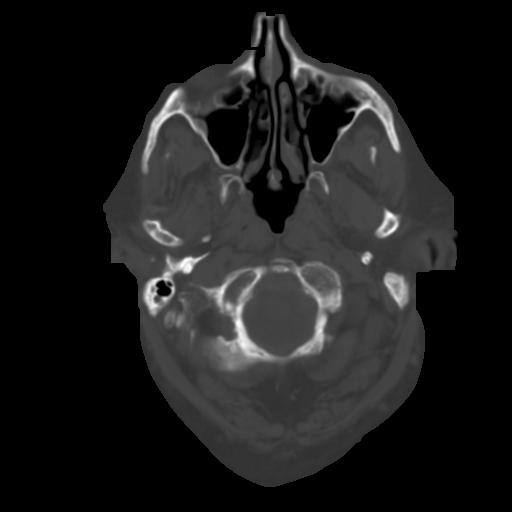
[im 5/32  brain]
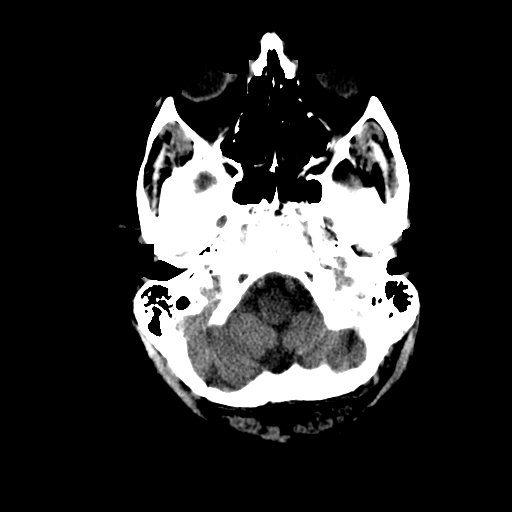
[im 7/32  brain]
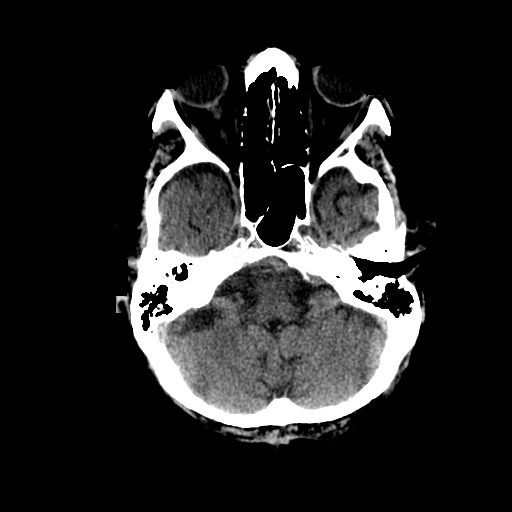
[im 9/32  brain]
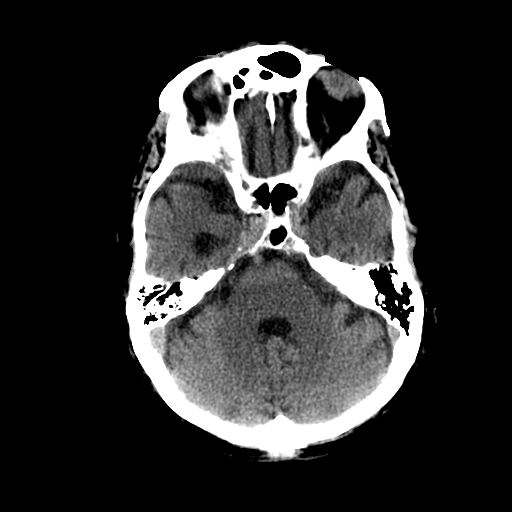
[im 12/32  brain]
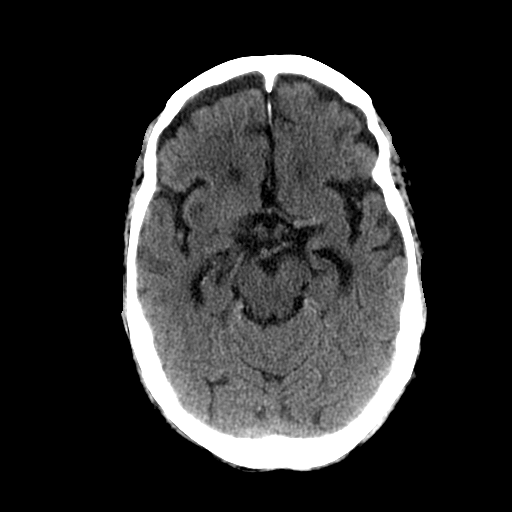
[im 12/32  bone]
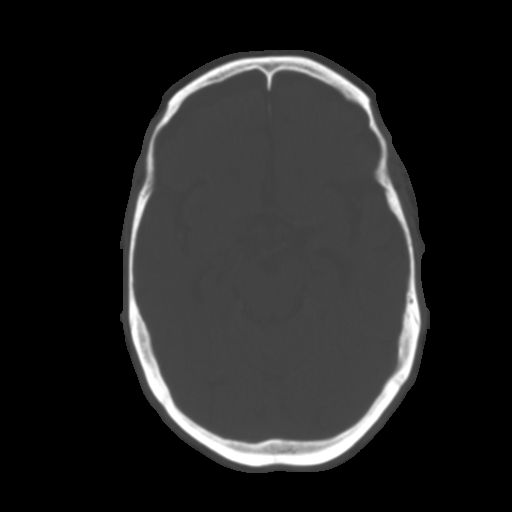
[im 14/32  brain]
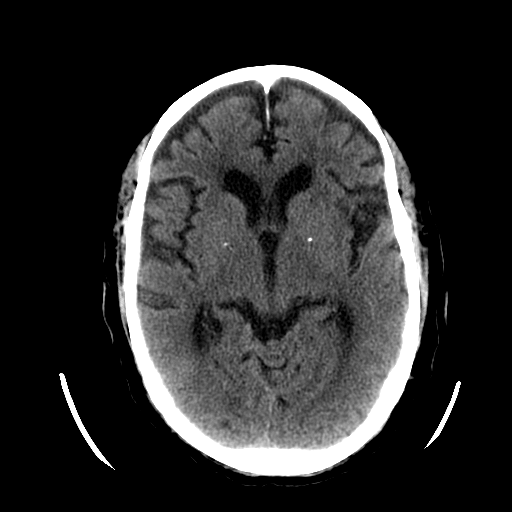
[im 16/32  brain]
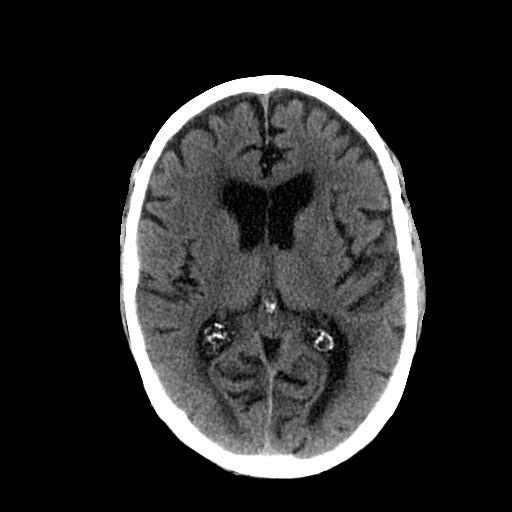
[im 18/32  brain]
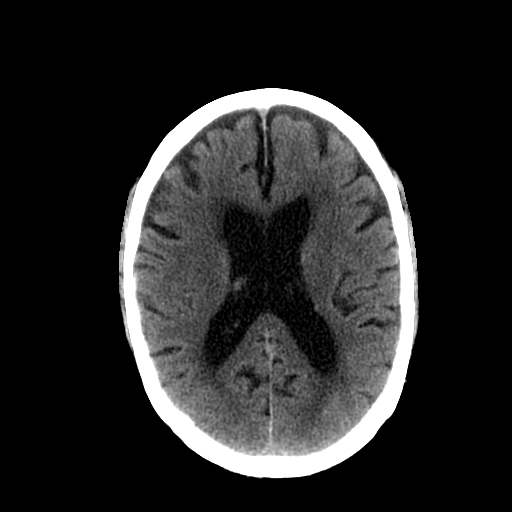
[im 20/32  brain]
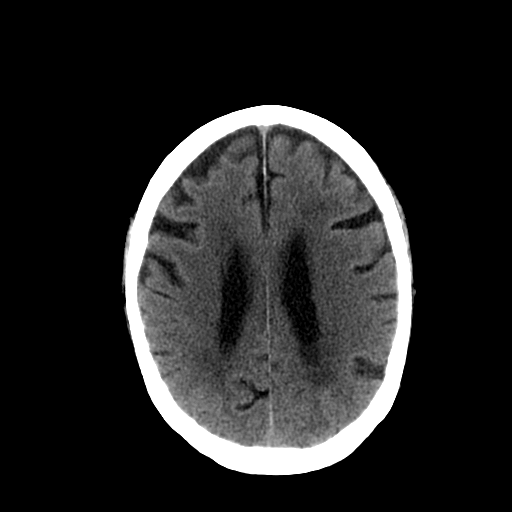
[im 20/32  bone]
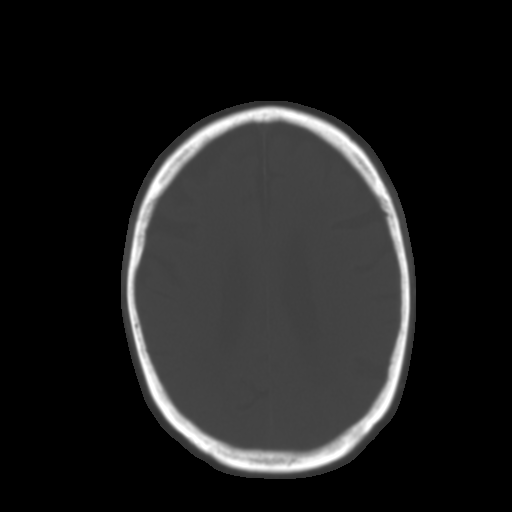
[im 23/32  brain]
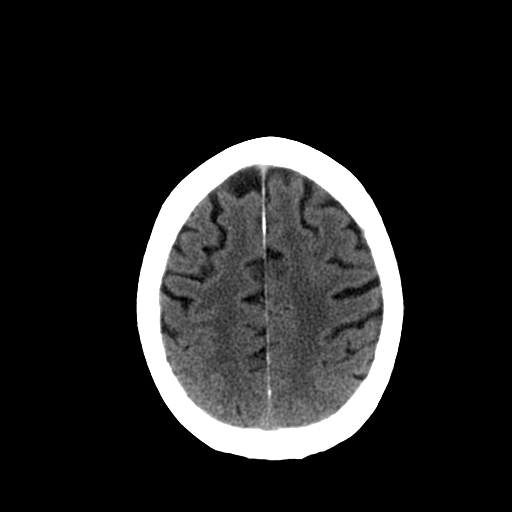
[im 25/32  brain]
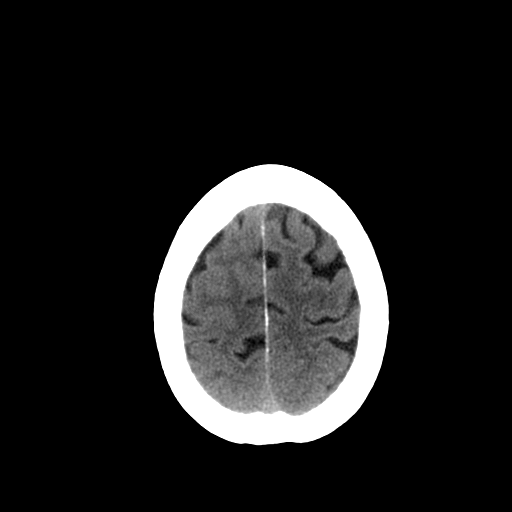
[im 27/32  brain]
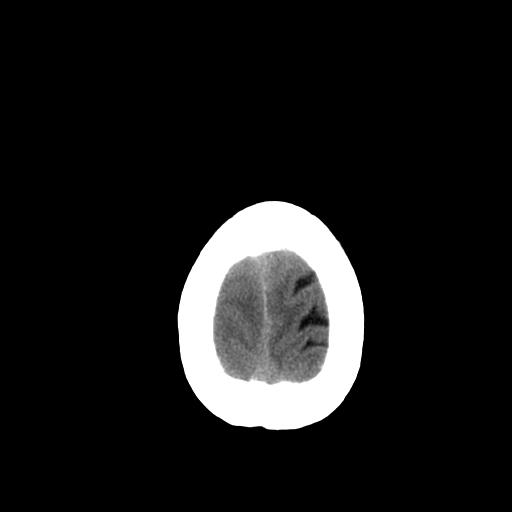
[im 29/32  brain]
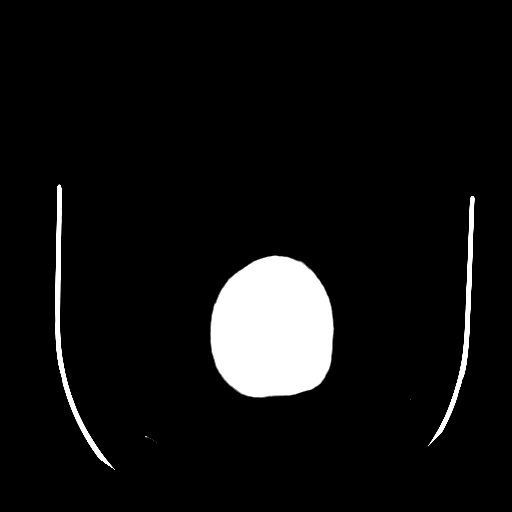
[im 29/32  bone]
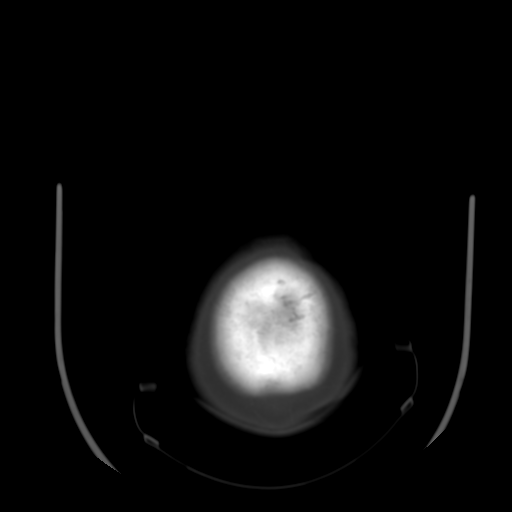

[13 of 30 positions shown; findings below may reference images not displayed]

FINDINGS: There is atrophy and chronic small vessel disease
changes. No acute intracranial abnormality.  Specifically, no
hemorrhage, hydrocephalus, mass lesion, acute infarction, or
significant intracranial injury.  No acute calvarial abnormality.
Visualized paranasal sinuses and mastoids clear.  Orbital soft
tissues unremarkable.
IMPRESSION: No acute intracranial abnormality.

Atrophy, chronic microvascular disease.

## 2014-01-26 ENCOUNTER — Ambulatory Visit: Payer: Medicare Other | Attending: Physician Assistant | Admitting: Physical Therapy

## 2014-01-26 DIAGNOSIS — Z5189 Encounter for other specified aftercare: Secondary | ICD-10-CM | POA: Diagnosis present

## 2014-01-26 DIAGNOSIS — M65332 Trigger finger, left middle finger: Secondary | ICD-10-CM | POA: Insufficient documentation

## 2014-01-26 DIAGNOSIS — M25522 Pain in left elbow: Secondary | ICD-10-CM | POA: Diagnosis not present

## 2014-01-26 DIAGNOSIS — M6281 Muscle weakness (generalized): Secondary | ICD-10-CM | POA: Diagnosis not present

## 2014-01-26 DIAGNOSIS — M5136 Other intervertebral disc degeneration, lumbar region: Secondary | ICD-10-CM | POA: Insufficient documentation

## 2016-01-15 ENCOUNTER — Encounter: Payer: Self-pay | Admitting: Neurology

## 2016-01-15 ENCOUNTER — Ambulatory Visit (INDEPENDENT_AMBULATORY_CARE_PROVIDER_SITE_OTHER): Payer: Medicare Other | Admitting: Neurology

## 2016-01-15 ENCOUNTER — Telehealth: Payer: Self-pay | Admitting: Neurology

## 2016-01-15 VITALS — BP 122/87 | HR 101 | Ht 72.0 in | Wt 224.0 lb

## 2016-01-15 DIAGNOSIS — F05 Delirium due to known physiological condition: Secondary | ICD-10-CM

## 2016-01-15 DIAGNOSIS — R269 Unspecified abnormalities of gait and mobility: Secondary | ICD-10-CM | POA: Insufficient documentation

## 2016-01-15 DIAGNOSIS — R413 Other amnesia: Secondary | ICD-10-CM

## 2016-01-15 HISTORY — DX: Other amnesia: R41.3

## 2016-01-15 HISTORY — DX: Unspecified abnormalities of gait and mobility: R26.9

## 2016-01-15 NOTE — Telephone Encounter (Signed)
Nikki/[place] [phone] called needing clara fication on summary of medications for today. She said if after 3:30 can speak with [place]/2nd shift RN

## 2016-01-15 NOTE — Telephone Encounter (Signed)
Returned [employer] and spoke to [place] @ [employer]. Verified that pt was to continue tapering up on Namenda. For now, he is to continue Cymbalta at 40 mg daily. Caregivers may call if behavior worsens and Dr. Jannifer Franklin would consider going up on Cymbalta to 60 mg daily. Until then, will watch for side effects or improvement on Namenda.

## 2016-01-15 NOTE — Progress Notes (Signed)
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 The patient has had a significant decline in his cognitive functioning over the last one year. The patient has a tendency for OCD behavior, he will scratch sores in his skin on a regular basis. The patient has begun having problems with sundowning that may begin around 3 or 4 in the evening, and get severe as the night progresses. The patient has difficulty with gait instability, he has fallen frequently, he has immobility in part related to right hip arthritis. The patient has undergone recent CT evaluation of the brain showing some white matter changes, no acute changes were seen. There was also an evaluation with CT of the cervical spine that showed degenerative changes without fracture. The patient was on Prozac until about a year ago, he was switched to Cymbalta, his caretaker believes that his cognitive changes worsened after that. The is on a very low dose of Cymbalta taking only 20 mg daily. The patient is on low-dose Zyprexa taking 2.5 mg a day, higher doses resulted in elevation of liver enzymes. The patient has had some improvement in his OCD behavior on the Zyprexa. The patient denies any problems with weakness or numbness of the extremities, he denies headache or dizziness. He reports a good energy level. He just recently in the last several days was started on Namenda, this dose is gradually being increased. The is sent to this office for further evaluation. A vitamin B12 level has been checked and apparently is unremarkable.  Past Medical History:  Diagnosis Date  . Alcohol abuse   . Arthritis   . Macular degeneration   . OCD (obsessive compulsive disorder)   . Prostate cancer [facility])       Past Surgical History:  Procedure Laterality Date  . FRACTURE SURGERY      History reviewed. No pertinent family history.  Social history:  reports that he has quit smoking. He has never used smokeless tobacco. He reports that he drinks alcohol. He reports that he does not use drugs.  Medications:  Prior to Admission medications   Medication Sig Start Date End Date Taking? Authorizing Provider  ALPRAZolam (XANAX PO) Take by mouth as needed.   Yes Historical Provider, MD  aspirin EC 81 MG tablet Take 81 mg by mouth daily.   Yes Historical Provider, MD  calcium carbonate (OS-CAL) 1250 (500 Ca) MG chewable tablet Take 600 mg by mouth daily.   Yes Historical Provider, MD  Cholecalciferol (VITAMIN D PO) Take 5,000 Units by mouth daily.   Yes Historical Provider, MD  DULoxetine (CYMBALTA) 20 MG capsule Take 20 mg by mouth daily.   Yes Historical Provider, MD  FLUoxetine (PROZAC) 20 MG capsule Take 40 mg by mouth daily.   Yes Historical Provider, MD  glucosamine-chondroitin 500-400 MG tablet Take 1 tablet by mouth 2 (two) times daily.   Yes Historical Provider, MD  HYDROcodone-acetaminophen (NORCO/VICODIN) 5-325 MG per tablet Take 1 tablet by mouth every 6 (six) hours as needed. For pain.   Yes Historical Provider, MD  loratadine (CLARITIN) 10 MG tablet Take 10 mg by mouth daily.   Yes Historical Provider, MD  memantine [facility] TITRATION PACK) tablet pack Take by mouth See admin instructions. 5 mg/day  for =1 week; 5 mg twice daily for =1 week; 15 mg/day given in 5 mg and 10 mg separated doses for =1 week; then 10 mg twice daily   Yes Historical Provider, MD  Multiple Vitamin (MULTIVITAMIN WITH MINERALS) TABS Take 1 tablet by mouth daily.   Yes Historical Provider, MD  OLANZapine (ZYPREXA) 2.5 MG tablet Take 2.5 mg by mouth at bedtime.   Yes Historical Provider, MD  polyethylene glycol (MIRALAX / GLYCOLAX) packet Take 17 g by mouth daily as needed. For constipation.   Yes Historical Provider,  MD  sennosides-docusate sodium (SENOKOT-S) 8.6-50 MG tablet Take 1 tablet by mouth daily.   Yes Historical Provider, MD     No Known Allergies  ROS:  Out of a complete 14 system review of symptoms, the patient complains only of the following symptoms, and all other reviewed systems are negative.  Shortness of breath Incontinence of bowel and bladder Itching Joint pain Allergies, runny nose Memory loss, confusion Depression, anxiety, decreased energy, disinterest in activities, hallucinations, racing thoughts Sleepiness   Blood pressure 122/87, pulse (!) 101, height 6' (1.829 m), weight 224 lb (101.6 kg).  Physical Exam  General: The patient is alert and cooperative at the time of the examination.The patient is moderately to markedly obese.  Eyes: Pupils are equal, round, and reactive to light. Discs are flat bilaterally.  Neck: The neck is supple, no carotid bruits are noted.  Respiratory: The respiratory examination is clear.  Cardiovascular: The cardiovascular examination reveals a regular rate and rhythm, no obvious murmurs or rubs are noted.  Skin: Extremities are without significant edema.  Neurologic Exam  Mental status: The patient is alert and oriented x 2 at the time of the examination (not oriented to date). The Mini-Mental Status Examination done today shows a total score of 19/30.  Cranial nerves: Facial symmetry is present. There is good sensation of the face to pinprick and soft touch bilaterally. The strength of the facial muscles and the muscles to head turning and shoulder shrug are normal bilaterally. Speech is well enunciated, no aphasia or dysarthria is noted. Extraocular movements are full. Visual fields are full. The tongue is midline, and the patient has symmetric elevation of the soft palate. No obvious hearing deficits are noted.  Motor: The motor testing reveals 5 over 5 strength of all 4 extremities, With exception of difficulty with hip flexion on  the right. Good symmetric motor tone is noted throughout.  Sensory: Sensory testing is intact to pinprick, soft touch, vibration sensation, and position sense on all 4 extremities. No evidence of extinction is noted.  Coordination: Cerebellar testing reveals good finger-nose-finger and heel-to-shin bilaterally, With exception that there is difficulty with heel-to-shin with the right leg.  Gait and station: The patient is in a power wheelchair. He is able to stand with transfers, cannot effectively ambulate.  Reflexes: Deep tendon reflexes are symmetric, but are decreased bilaterally. Toes are downgoing bilaterally.   Assessment/Plan:  1. Progressive memory disturbance   2. Gait disturbance   3. Sundowning   The patient has had some issues with behavior in the evening hours associated with sundowning. The patient is getting on Namenda, I would agree with this. If the behavior remains an issue, I would consider going up on Cymbalta to 40 mg daily and eventually to 60 mg daily. The Zyprexa cannot be increased secondary to elevations in liver enzymes previously. The patient will follow-up through this office in about 4 months.    Jill Alexanders  MD 01/15/2016 9:36 AM  [facility] [address] [place], [place] [address]  Phone [phone] Fax [phone]

## 2016-05-19 ENCOUNTER — Ambulatory Visit (INDEPENDENT_AMBULATORY_CARE_PROVIDER_SITE_OTHER): Payer: Medicare Other | Admitting: Neurology

## 2016-05-19 ENCOUNTER — Encounter: Payer: Self-pay | Admitting: Neurology

## 2016-05-19 VITALS — BP 125/85 | HR 94 | Ht 72.0 in

## 2016-05-19 DIAGNOSIS — R413 Other amnesia: Secondary | ICD-10-CM | POA: Diagnosis not present

## 2016-05-19 DIAGNOSIS — R269 Unspecified abnormalities of gait and mobility: Secondary | ICD-10-CM

## 2016-05-19 MED ORDER — MIRTAZAPINE 15 MG PO TABS: 15.0000 mg | ORAL_TABLET | Freq: Every day | ORAL | 3 refills | Status: AC

## 2016-05-19 MED ORDER — MEMANTINE HCL 10 MG PO TABS: 10.0000 mg | ORAL_TABLET | Freq: Two times a day (BID) | ORAL | 3 refills | Status: AC

## 2016-05-19 NOTE — Patient Instructions (Signed)
   We will go up on the Namenda 10 mg to 5 mg in the morning and 10 mg in the evening for 1 week, then take 10 mg twice a day.   With the Remeron, we will go up  To the 15 mg dose at night.  Stop the Cymbalta.

## 2016-05-19 NOTE — Progress Notes (Signed)
Reason for visit: Memory disturbance  Carlos Torres is an 83 y.o. male  History of present illness:  Carlos Torres is a 83 year old right-handed white male with a history of a progressive memory disturbance. The patient also has OCD tendencies, and some agitation. 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 The patient has been placed on low-dose Zyprexa, but higher doses result in elevated liver enzymes. The patient has been placed on Remeron in a low dose of 7.5 mg at night which has helped him sleep, this has also helped some with agitation. He was placed on Namenda, but was never placed on a full maintenance dose of 10 mg twice daily, instead he takes 10 mg daily. He has been on one half of a 0.25 mg clonazepam tablet daily. This initially made him sleepy, but now he is able to tolerate the dose well. He is nonambulatory essentially, he has problems with the right hip. He can stand for transfers, he has not had any recent falls. , Past Medical History:  Diagnosis Date  . Abnormality of gait 01/15/2016  . Alcohol abuse   . Arthritis   . Macular degeneration   . Memory difficulties 01/15/2016  . OCD (obsessive compulsive disorder)   . Prostate cancer [facility])     Past Surgical History:  Procedure Laterality Date  . FRACTURE SURGERY      History reviewed. No pertinent family history.  Social history:  reports that he has quit smoking. He has never used smokeless tobacco. He reports that he drinks alcohol. He reports that he does not use drugs.   No Known Allergies  Medications:  Prior to Admission medications   Medication Sig Start Date End Date Taking? Authorizing Provider  aspirin EC 81 MG tablet Take 81 mg by mouth daily.   Yes Historical Provider, MD  calcium carbonate (OS-CAL) 1250 (500 Ca) MG chewable tablet Take 600 mg by mouth daily.   Yes Historical Provider, MD  carvedilol (COREG) 3.125 MG tablet Take 3.125 mg by mouth 2 (two) times  daily with a meal.   Yes Historical Provider, MD  Cholecalciferol (VITAMIN D PO) Take 5,000 Units by mouth daily.   Yes Historical Provider, MD  ClonazePAM (KLONOPIN PO) Take 0.25 mg by mouth daily. 1/2 tablet daily   Yes Historical Provider, MD  glucosamine-chondroitin 500-400 MG tablet Take 1 tablet by mouth 2 (two) times daily.   Yes Historical Provider, MD  ibuprofen (ADVIL,MOTRIN) 200 MG tablet Take 200 mg by mouth every 6 (six) hours as needed.   Yes Historical Provider, MD  loratadine (CLARITIN) 10 MG tablet Take 10 mg by mouth 2 (two) times daily.    Yes Historical Provider, MD  OLANZapine (ZYPREXA) 2.5 MG tablet Take 2.5 mg by mouth at bedtime.   Yes Historical Provider, MD  omeprazole (PRILOSEC) 40 MG capsule Take 40 mg by mouth daily.    Yes Historical Provider, MD  polyethylene glycol (MIRALAX / GLYCOLAX) packet Take 17 g by mouth daily as needed. For constipation.   Yes Historical Provider, MD  sennosides-docusate sodium (SENOKOT-S) 8.6-50 MG tablet Take 1 tablet by mouth daily.   Yes Historical Provider, MD  memantine (NAMENDA) 10 MG tablet Take 1 tablet (10 mg total) by mouth 2 (two) times daily. 05/19/16   Kathrynn Ducking, MD  mirtazapine (REMERON) 15 MG tablet Take 1 tablet (15 mg total) by mouth at bedtime. 05/19/16   Kathrynn Ducking, MD    ROS:  Out of  a complete 14 system review of symptoms, the patient complains only of the following symptoms, and all other reviewed systems are negative.  Excessive sweating Decreased vision right eye Daytime sleepiness, acting out dreams Incontinence of the bladder Joint pain, walking difficulty Memory loss, dizziness Agitation, behavior problem, confusion, depression, anxiety Itching  Blood pressure 125/85, pulse 94, height 6' (1.829 m), SpO2 94 %.  Physical Exam  General: The patient is alert and cooperative at the time of the examination. The patient is markedly obese.  Skin: No significant peripheral edema is  noted.   Neurologic Exam  Mental status: The patient is alert and oriented x 2 at the time of the examination (not oriented to date). The Mini-Mental Status Examination done today shows a total score of 19/30.   Cranial nerves: Facial symmetry is present. Speech is normal, no aphasia or dysarthria is noted. Extraocular movements are full. Visual fields are full.  Motor: The patient has good strength in all 4 extremities, but the patient is not able to flex at the right hip .  Sensory examination: Soft touch sensation is symmetric on the face, arms, and legs.  Coordination: The patient has good finger-nose-finger and heel-to-shin bilaterally, but the patient was not able to perform heel-to-shin on the right leg .  Gait and station: The patient is in a motorized wheelchair, gait was not tested today.  Reflexes: Deep tendon reflexes are symmetric, but are depressed .   Assessment/Plan:  1. Progressive memory disturbance  2. Gait disturbance  3. OCD  The patient is doing somewhat better on the Remeron, and the Namenda was never increased to the full maintenance dose. The Namenda will be increased taking 5 mg in the morning and 10 mg in the evening for one week, then go to 10 mg twice daily. The patient is to go up on the Remeron taking 15 mg at night. This may help some of the agitation and the OCD behavior. The patient will stop the Cymbalta. He will return to this office in 6 months.  Jill Alexanders MD 05/19/2016 11:09 AM  [facility] [address] [place], [place] [address]  Phone [phone] Fax [phone]

## 2016-11-17 ENCOUNTER — Ambulatory Visit: Payer: Medicare Other | Admitting: Adult Health

## 2019-05-09 DEATH — deceased
